# Patient Record
Sex: Female | Born: 1965 | Hispanic: No | Marital: Married | Smoking: Never smoker
Health system: Southern US, Community
[De-identification: ages and names within clinical notes are randomized; demographics above are authoritative.]

---

## 2003-06-06 ENCOUNTER — Other Ambulatory Visit: Admission: RE | Admit: 2003-06-06 | Discharge: 2003-06-06 | Payer: Self-pay | Admitting: Gynecology

## 2006-09-02 ENCOUNTER — Other Ambulatory Visit: Admission: RE | Admit: 2006-09-02 | Discharge: 2006-09-02 | Payer: Self-pay | Admitting: Gynecology

## 2006-09-18 ENCOUNTER — Encounter: Admission: RE | Admit: 2006-09-18 | Discharge: 2006-09-18 | Payer: Self-pay | Admitting: Gynecology

## 2007-09-17 ENCOUNTER — Other Ambulatory Visit: Admission: RE | Admit: 2007-09-17 | Discharge: 2007-09-17 | Payer: Self-pay | Admitting: Gynecology

## 2007-09-24 ENCOUNTER — Encounter: Admission: RE | Admit: 2007-09-24 | Discharge: 2007-09-24 | Payer: Self-pay | Admitting: Gynecology

## 2008-09-27 ENCOUNTER — Encounter: Admission: RE | Admit: 2008-09-27 | Discharge: 2008-09-27 | Payer: Self-pay | Admitting: Gynecology

## 2009-04-13 ENCOUNTER — Emergency Department (HOSPITAL_BASED_OUTPATIENT_CLINIC_OR_DEPARTMENT_OTHER): Admission: EM | Admit: 2009-04-13 | Discharge: 2009-04-13 | Payer: Self-pay | Admitting: Emergency Medicine

## 2009-04-13 ENCOUNTER — Ambulatory Visit: Payer: Self-pay | Admitting: Interventional Radiology

## 2009-05-12 ENCOUNTER — Encounter: Admission: RE | Admit: 2009-05-12 | Discharge: 2009-05-17 | Payer: Self-pay | Admitting: Orthopedic Surgery

## 2009-09-29 ENCOUNTER — Encounter: Admission: RE | Admit: 2009-09-29 | Discharge: 2009-09-29 | Payer: Self-pay | Admitting: Gynecology

## 2009-10-09 ENCOUNTER — Encounter: Admission: RE | Admit: 2009-10-09 | Discharge: 2009-10-09 | Payer: Self-pay | Admitting: Gynecology

## 2010-10-04 ENCOUNTER — Encounter
Admission: RE | Admit: 2010-10-04 | Discharge: 2010-10-04 | Payer: Self-pay | Source: Home / Self Care | Attending: Gynecology | Admitting: Gynecology

## 2010-11-04 ENCOUNTER — Encounter: Payer: Self-pay | Admitting: Gynecology

## 2011-09-19 ENCOUNTER — Other Ambulatory Visit: Payer: Self-pay | Admitting: Gynecology

## 2011-09-19 DIAGNOSIS — Z1231 Encounter for screening mammogram for malignant neoplasm of breast: Secondary | ICD-10-CM

## 2011-10-21 ENCOUNTER — Ambulatory Visit
Admission: RE | Admit: 2011-10-21 | Discharge: 2011-10-21 | Disposition: A | Discharge status: Home / Self Care | Payer: 59 | Source: Ambulatory Visit | Attending: Gynecology | Admitting: Gynecology

## 2011-10-21 DIAGNOSIS — Z1231 Encounter for screening mammogram for malignant neoplasm of breast: Secondary | ICD-10-CM

## 2012-09-30 ENCOUNTER — Other Ambulatory Visit: Payer: Self-pay | Admitting: Gynecology

## 2012-09-30 DIAGNOSIS — Z1231 Encounter for screening mammogram for malignant neoplasm of breast: Secondary | ICD-10-CM

## 2012-11-03 ENCOUNTER — Ambulatory Visit
Admission: RE | Admit: 2012-11-03 | Discharge: 2012-11-03 | Disposition: A | Discharge status: Home / Self Care | Payer: 59 | Source: Ambulatory Visit | Attending: Gynecology | Admitting: Gynecology

## 2012-11-03 DIAGNOSIS — Z1231 Encounter for screening mammogram for malignant neoplasm of breast: Secondary | ICD-10-CM

## 2013-11-08 ENCOUNTER — Other Ambulatory Visit: Payer: Self-pay

## 2013-11-08 DIAGNOSIS — Z1231 Encounter for screening mammogram for malignant neoplasm of breast: Secondary | ICD-10-CM

## 2013-11-18 ENCOUNTER — Ambulatory Visit
Admission: RE | Admit: 2013-11-18 | Discharge: 2013-11-18 | Disposition: A | Discharge status: Home / Self Care | Payer: 59 | Source: Ambulatory Visit

## 2013-11-18 DIAGNOSIS — Z1231 Encounter for screening mammogram for malignant neoplasm of breast: Secondary | ICD-10-CM

## 2014-11-08 ENCOUNTER — Other Ambulatory Visit: Payer: Self-pay

## 2014-11-08 DIAGNOSIS — Z1231 Encounter for screening mammogram for malignant neoplasm of breast: Secondary | ICD-10-CM

## 2014-11-21 ENCOUNTER — Ambulatory Visit
Admission: RE | Admit: 2014-11-21 | Discharge: 2014-11-21 | Disposition: A | Discharge status: Home / Self Care | Payer: 59 | Source: Ambulatory Visit

## 2014-11-21 DIAGNOSIS — Z1231 Encounter for screening mammogram for malignant neoplasm of breast: Secondary | ICD-10-CM

## 2015-10-31 ENCOUNTER — Other Ambulatory Visit: Payer: Self-pay

## 2015-10-31 DIAGNOSIS — Z1231 Encounter for screening mammogram for malignant neoplasm of breast: Secondary | ICD-10-CM

## 2015-11-27 ENCOUNTER — Ambulatory Visit
Admission: RE | Admit: 2015-11-27 | Discharge: 2015-11-27 | Disposition: A | Discharge status: Home / Self Care | Payer: 59 | Source: Ambulatory Visit

## 2015-11-27 DIAGNOSIS — Z1231 Encounter for screening mammogram for malignant neoplasm of breast: Secondary | ICD-10-CM

## 2016-11-04 ENCOUNTER — Other Ambulatory Visit: Payer: Self-pay | Admitting: Family Medicine

## 2016-11-04 DIAGNOSIS — Z1231 Encounter for screening mammogram for malignant neoplasm of breast: Secondary | ICD-10-CM

## 2016-11-28 ENCOUNTER — Ambulatory Visit
Admission: RE | Admit: 2016-11-28 | Discharge: 2016-11-28 | Disposition: A | Discharge status: Home / Self Care | Payer: 59 | Source: Ambulatory Visit | Attending: Family Medicine | Admitting: Family Medicine

## 2016-11-28 DIAGNOSIS — Z1231 Encounter for screening mammogram for malignant neoplasm of breast: Secondary | ICD-10-CM

## 2016-12-06 ENCOUNTER — Emergency Department (HOSPITAL_BASED_OUTPATIENT_CLINIC_OR_DEPARTMENT_OTHER)
Admission: EM | Admit: 2016-12-06 | Discharge: 2016-12-06 | Disposition: A | Discharge status: Home / Self Care | Payer: 59 | Attending: Emergency Medicine | Admitting: Emergency Medicine

## 2016-12-06 ENCOUNTER — Emergency Department (HOSPITAL_BASED_OUTPATIENT_CLINIC_OR_DEPARTMENT_OTHER): Payer: 59

## 2016-12-06 ENCOUNTER — Encounter (HOSPITAL_BASED_OUTPATIENT_CLINIC_OR_DEPARTMENT_OTHER): Payer: Self-pay | Admitting: *Deleted

## 2016-12-06 DIAGNOSIS — S299XXA Unspecified injury of thorax, initial encounter: Secondary | ICD-10-CM | POA: Diagnosis present

## 2016-12-06 DIAGNOSIS — W108XXA Fall (on) (from) other stairs and steps, initial encounter: Secondary | ICD-10-CM | POA: Diagnosis not present

## 2016-12-06 DIAGNOSIS — W19XXXA Unspecified fall, initial encounter: Secondary | ICD-10-CM

## 2016-12-06 DIAGNOSIS — R0781 Pleurodynia: Secondary | ICD-10-CM | POA: Insufficient documentation

## 2016-12-06 DIAGNOSIS — M546 Pain in thoracic spine: Secondary | ICD-10-CM | POA: Diagnosis not present

## 2016-12-06 DIAGNOSIS — Y999 Unspecified external cause status: Secondary | ICD-10-CM | POA: Insufficient documentation

## 2016-12-06 DIAGNOSIS — Y939 Activity, unspecified: Secondary | ICD-10-CM | POA: Insufficient documentation

## 2016-12-06 DIAGNOSIS — Y929 Unspecified place or not applicable: Secondary | ICD-10-CM | POA: Diagnosis not present

## 2016-12-06 MED ORDER — IBUPROFEN 800 MG PO TABS
800.0000 mg | ORAL_TABLET | Freq: Once | ORAL | Status: AC
  Administered 2016-12-06: 800 mg via ORAL
  Filled 2016-12-06: qty 1

## 2016-12-06 MED ORDER — IBUPROFEN 600 MG PO TABS: 600.0000 mg | ORAL_TABLET | Freq: Four times a day (QID) | ORAL | 0 refills | Status: AC | PRN

## 2016-12-06 NOTE — ED Notes (Signed)
Pt. Does not want ice on her back/rib area.

## 2016-12-06 NOTE — ED Triage Notes (Signed)
Pt reports slip and fall down flight of stairs onto hardwood floor. Denies head injury or loc, states her pain is in the left upper back area, tender to palp.

## 2016-12-06 NOTE — ED Provider Notes (Signed)
[facility] Note   CSN: [id number] Arrival date & time: 12/06/16  1557  By signing my name below, I, Modena Jansky, attest that this documentation has been prepared under the direction and in the presence of non-physician practitioner, Mathews Robinsons, PA-C. Electronically Signed: Modena Jansky, Scribe. 12/06/2016. 5:48 PM.  History   Chief Complaint Chief Complaint  Patient presents with  . Fall   The history is provided by the patient. No language interpreter was used.   HPI Comments: Kim Phillips is a 51 y.o. female who presents to the Emergency Department complaining of a fall that occurred today. She states she slipped, fell down 4-5 steps, and landed on her back on hardwood floor. She denies any LOC or head injury. No treatment PTA. She reports associated constant moderate left upper back pain. Her pain is exacerbated by raising her LUE, deep breathing, and bending over. She denies any visual disturbance, nausea, vomiting, gait problem, or dizziness.     PCP: Shirlean Kelly, MD  History reviewed. No pertinent past medical history.  There are no active problems to display for this patient.   History reviewed. No pertinent surgical history.  OB History    No data available       Home Medications    Prior to Admission medications   Medication Sig Start Date End Date Taking? Authorizing Provider  ibuprofen (ADVIL,MOTRIN) 600 MG tablet Take 1 tablet (600 mg total) by mouth every 6 (six) hours as needed. 12/06/16   Georgiana Shore, PA-C    Family History History reviewed. No pertinent family history.  Social History Social History  Substance Use Topics  . Smoking status: Never Smoker  . Smokeless tobacco: Never Used  . Alcohol use Not on file     Allergies   Patient has no known allergies.   Review of Systems Review of Systems  Constitutional: Negative for fever.  HENT: Negative for sore throat.   Eyes: Negative for visual  disturbance.  Respiratory: Negative for shortness of breath.   Gastrointestinal: Negative for nausea and vomiting.  Musculoskeletal: Positive for back pain (Left upper). Negative for gait problem.  Skin: Negative for rash.  Neurological: Negative for dizziness and syncope.     Physical Exam Updated Vital Signs BP (!) 149/102 (BP Location: Left Arm)   Pulse 64   Temp 98.9 F (37.2 C) (Oral)   Resp 18   Ht 5\' 6"  (1.676 m)   Wt 166 lb (75.3 kg)   LMP 09/07/2011   SpO2 100%   BMI 26.79 kg/m   Physical Exam  Constitutional: She is oriented to person, place, and time. She appears well-developed and well-nourished. No distress.  Patient is afebrile, non-toxic appearing, seating comfortably in chair in no acute distress.  HENT:  Head: Normocephalic and atraumatic.  Eyes: Conjunctivae and EOM are normal. Pupils are equal, round, and reactive to light.  Neck: Normal range of motion. Neck supple.  Cardiovascular: Normal rate, regular rhythm, normal heart sounds and intact distal pulses.   Pulmonary/Chest: Effort normal. No respiratory distress. She has no wheezes. She has no rales.  Abdominal: Soft.  Musculoskeletal: Normal range of motion. She exhibits tenderness. She exhibits no edema or deformity.  TTP to the left 5th and 6th rib on th left posteriorly. Full ROM of all extremities. No midline TTP to the entire spine.   Neurological: She is alert and oriented to person, place, and time. No cranial nerve deficit or sensory deficit. She exhibits normal muscle  tone. Coordination normal.  Neurologic Exam:   - Mental status: Patient is alert and cooperative. Fluent speech and words are clear. Coherent thought processes and insight is good. Patient is oriented x 4 to person, place, time and event.   - Cranial nerves:  CN III, IV, VI: pupils equally round, reactive to light both direct and conscensual and normal accommodation. Full extra-ocular movement. CN VII : muscles of facial expression  intact. CN X :  midline uvula. XI strength of sternocleidomastoid and trapezius muscles 5/5, XII: tongue is midline when protruded.  - Motor: No involuntary movements. Muscle tone and bulk normal throughout. Muscle strength is 5/5 in bilateral shoulder abduction, elbow flexion and extension, thumb opposition, grip, hip extension, flexion, leg flexion and extension, ankle dorsiflexion and plantar flexion.   - Sensory: Proprioception, light tough sensation intact in all extremities.   - Cerebellar: rapid alternating movements and point to point movement intact in upper and lower extremities. Normal stance and gait   Skin: Skin is warm and dry. She is not diaphoretic. There is erythema. No pallor.  Slight erythema over upper back  Psychiatric: She has a normal mood and affect.  Nursing note and vitals reviewed.    ED Treatments / Results  DIAGNOSTIC STUDIES: Oxygen Saturation is 100% on RA, normal by my interpretation.    COORDINATION OF CARE: 5:52 PM- Pt advised of plan for treatment and pt agrees.  Labs (all labs ordered are listed, but only abnormal results are displayed) Labs Reviewed - No data to display  EKG  EKG Interpretation None       Radiology Dg Ribs Unilateral W/chest Left  Result Date: 12/06/2016 CLINICAL DATA:  Status post fall down flight of stairs, with left upper back pain. Initial encounter. EXAM: LEFT RIBS AND CHEST - 3+ VIEW COMPARISON:  None. FINDINGS: No displaced rib fractures are seen. The lungs are well-aerated and clear. There is no evidence of focal opacification, pleural effusion or pneumothorax. The cardiomediastinal silhouette is within normal limits. No acute osseous abnormalities are seen. IMPRESSION: No acute cardiopulmonary process seen. No displaced rib fractures identified. Electronically Signed   By: Roanna Raider M.D.   On: 12/06/2016 18:21    Procedures Procedures (including critical care time)  Medications Ordered in ED Medications    ibuprofen (ADVIL,MOTRIN) tablet 800 mg (800 mg Oral Given 12/06/16 1819)     Initial Impression / Assessment and Plan / ED Course  I have reviewed the triage vital signs and the nursing notes.  Pertinent labs & imaging results that were available during my care of the patient were reviewed by me and considered in my medical decision making (see chart for details).     Patient presents status post slip and fall. No loss of consciousness or hit to the head. She has pain to the left upper back. Exam is reassuring no focal neural deficit normal neuro exam. Onset clear and equal bilaterally.  Patient X-Ray negative for obvious fracture or dislocation. Pain managed in ED. Pt advised to follow up with PCP if symptoms persist for possibility of missed fracture diagnosis. Conservative therapy recommended and discussed. Patient will be dc home & is agreeable with above plan.   Discussed strict return precautions. Patient was advised to return to the emergency department if experiencing any new or worsening symptoms. Patient clearly understood instructions and agreed with discharge plan.    Final Clinical Impressions(s) / ED Diagnoses   Final diagnoses:  Fall, initial encounter    New Prescriptions  Discharge Medication List as of 12/06/2016  7:34 PM    START taking these medications   Details  ibuprofen (ADVIL,MOTRIN) 600 MG tablet Take 1 tablet (600 mg total) by mouth every 6 (six) hours as needed., Starting Fri 12/06/2016, Print       I personally performed the services described in this documentation, which was scribed in my presence. The recorded information has been reviewed and is accurate.     Georgiana ShoreJessica B Renie Stelmach, PA-C 12/06/16 2108    Arby BarretteMarcy Pfeiffer, MD 12/18/16 [phone]

## 2017-11-18 ENCOUNTER — Other Ambulatory Visit: Payer: Self-pay | Admitting: Family Medicine

## 2017-11-18 DIAGNOSIS — Z1231 Encounter for screening mammogram for malignant neoplasm of breast: Secondary | ICD-10-CM

## 2017-12-08 ENCOUNTER — Ambulatory Visit: Payer: 59

## 2018-02-18 ENCOUNTER — Ambulatory Visit
Admission: RE | Admit: 2018-02-18 | Discharge: 2018-02-18 | Disposition: A | Discharge status: Home / Self Care | Payer: 59 | Source: Ambulatory Visit | Attending: Family Medicine | Admitting: Family Medicine

## 2018-02-18 DIAGNOSIS — Z1231 Encounter for screening mammogram for malignant neoplasm of breast: Secondary | ICD-10-CM

## 2019-06-16 ENCOUNTER — Other Ambulatory Visit: Payer: Self-pay | Admitting: Family Medicine

## 2019-06-16 DIAGNOSIS — M8589 Other specified disorders of bone density and structure, multiple sites: Secondary | ICD-10-CM

## 2019-11-12 ENCOUNTER — Ambulatory Visit: Payer: 59 | Attending: Internal Medicine

## 2019-11-12 DIAGNOSIS — Z20822 Contact with and (suspected) exposure to covid-19: Secondary | ICD-10-CM

## 2019-11-13 LAB — NOVEL CORONAVIRUS, NAA: SARS-CoV-2, NAA: NOT DETECTED

## 2019-11-15 ENCOUNTER — Ambulatory Visit: Payer: 59

## 2019-11-24 IMAGING — MG DIGITAL SCREENING BILATERAL MAMMOGRAM WITH CAD
4 series · 4 of 4 positions shown · non-contrast
Comparison: Previous exam(s).

CLINICAL DATA: Screening.

EXAM:
DIGITAL SCREENING BILATERAL MAMMOGRAM WITH CAD

[L CC]
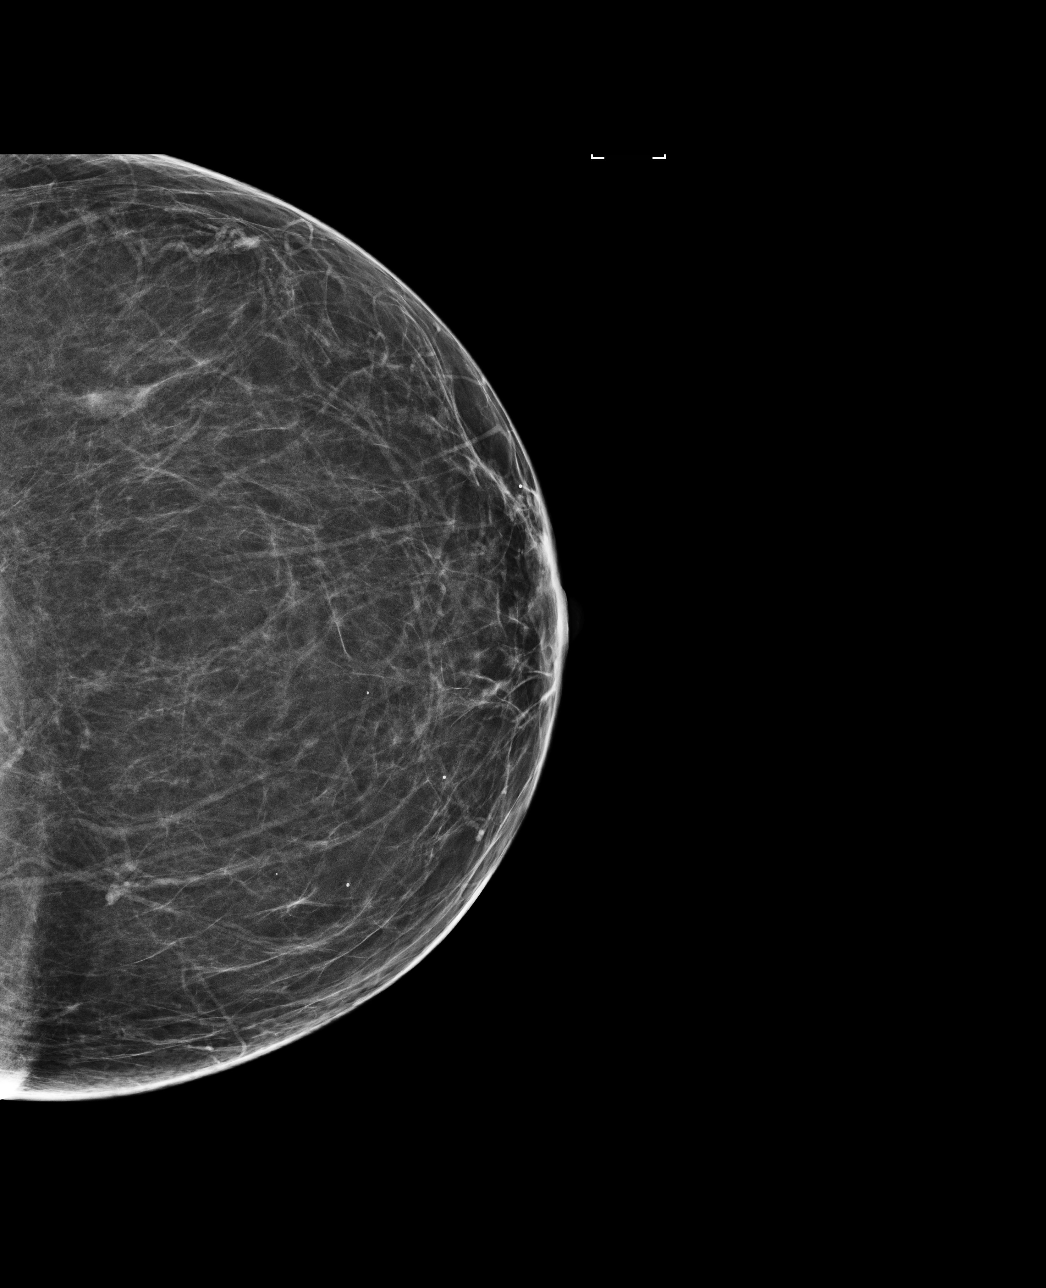

[R CC]
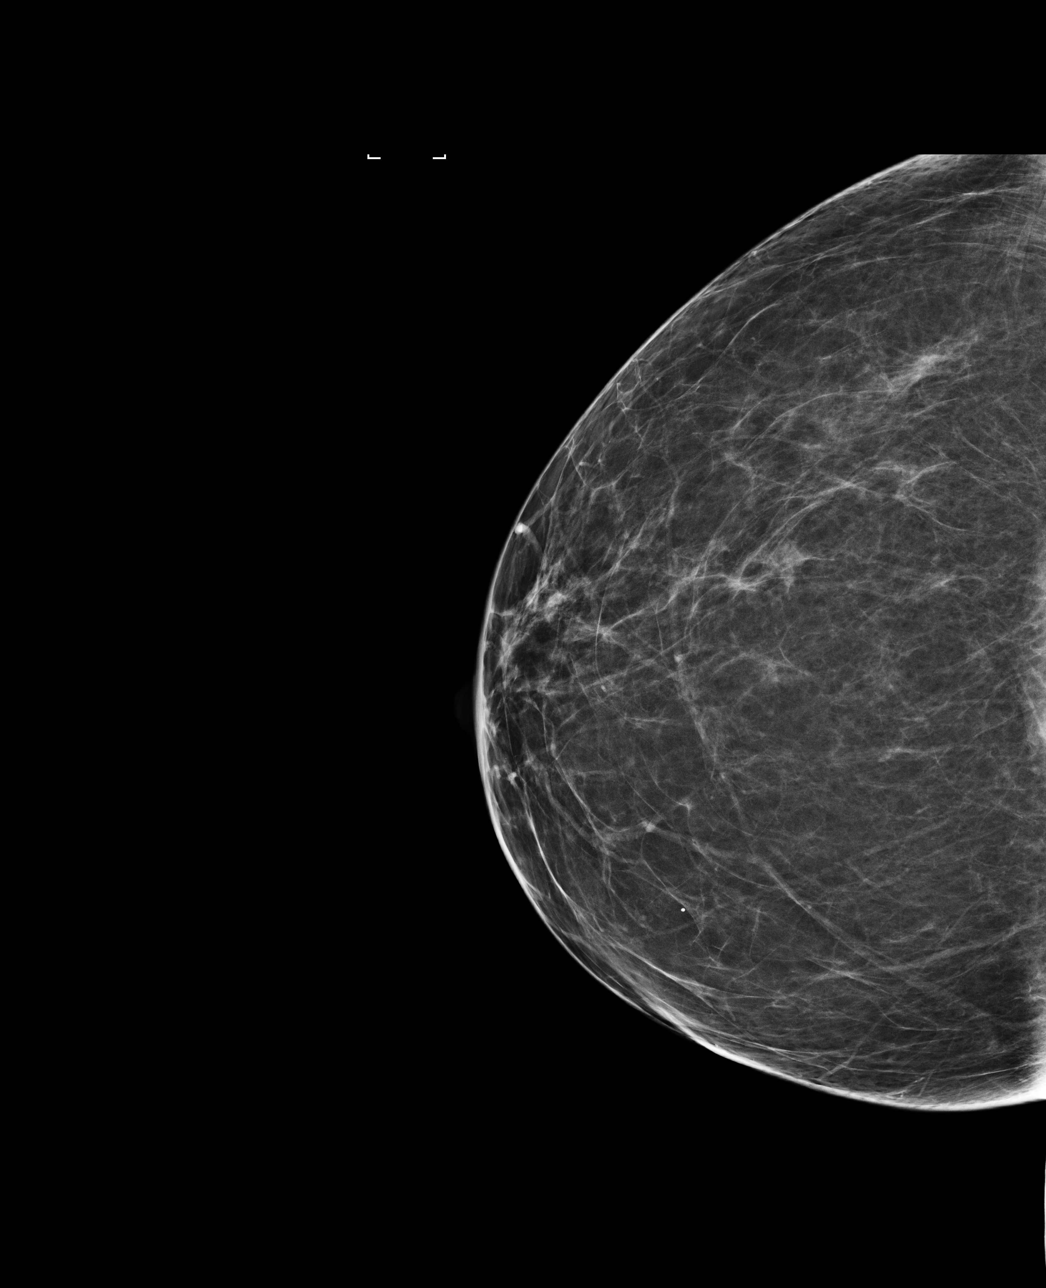

[R MLO]
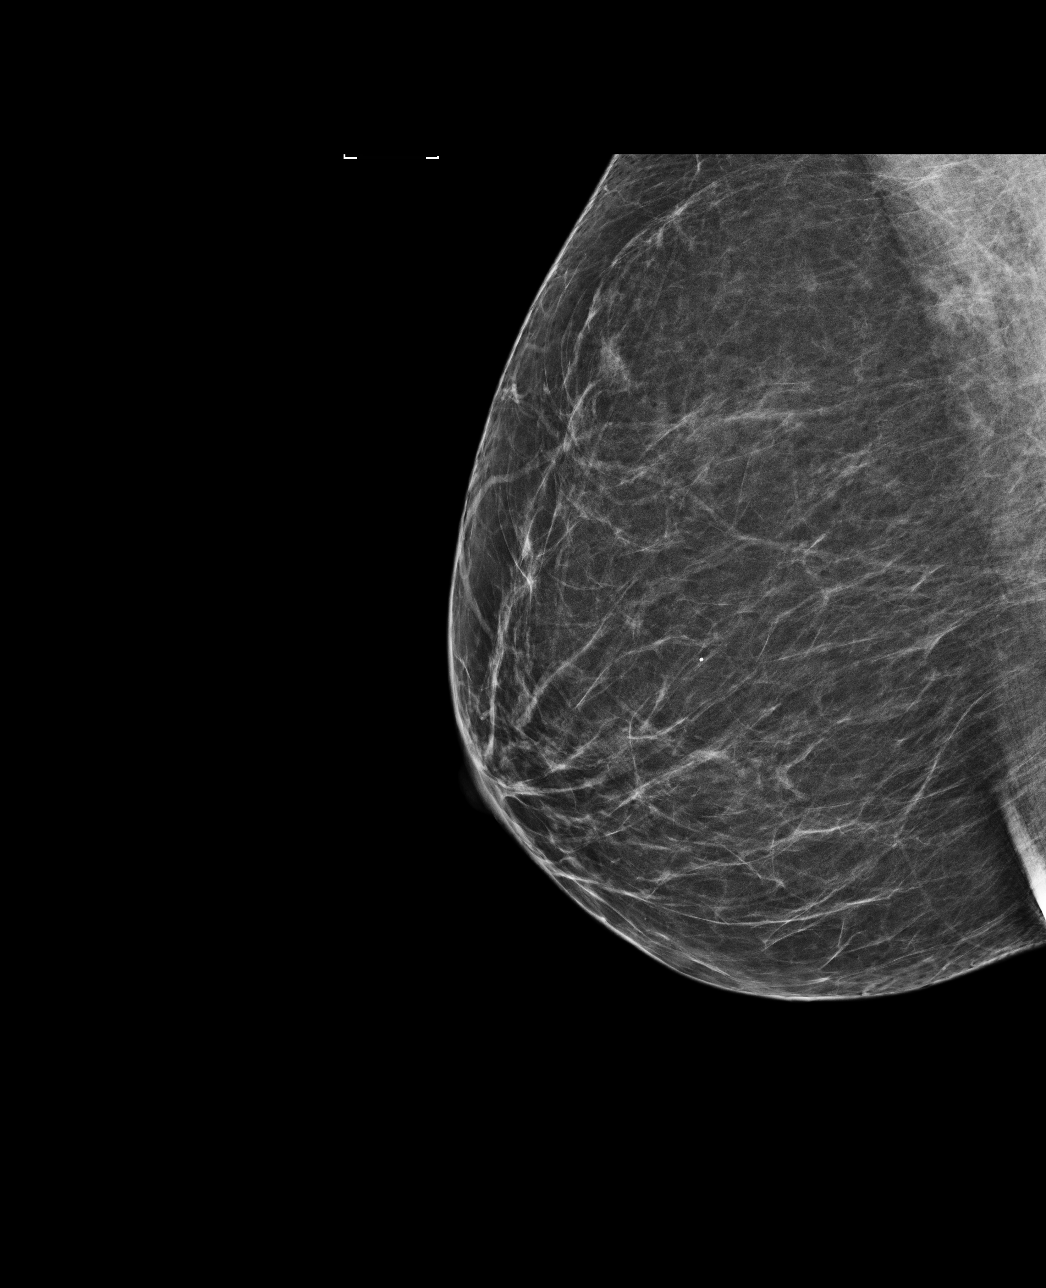

[L MLO]
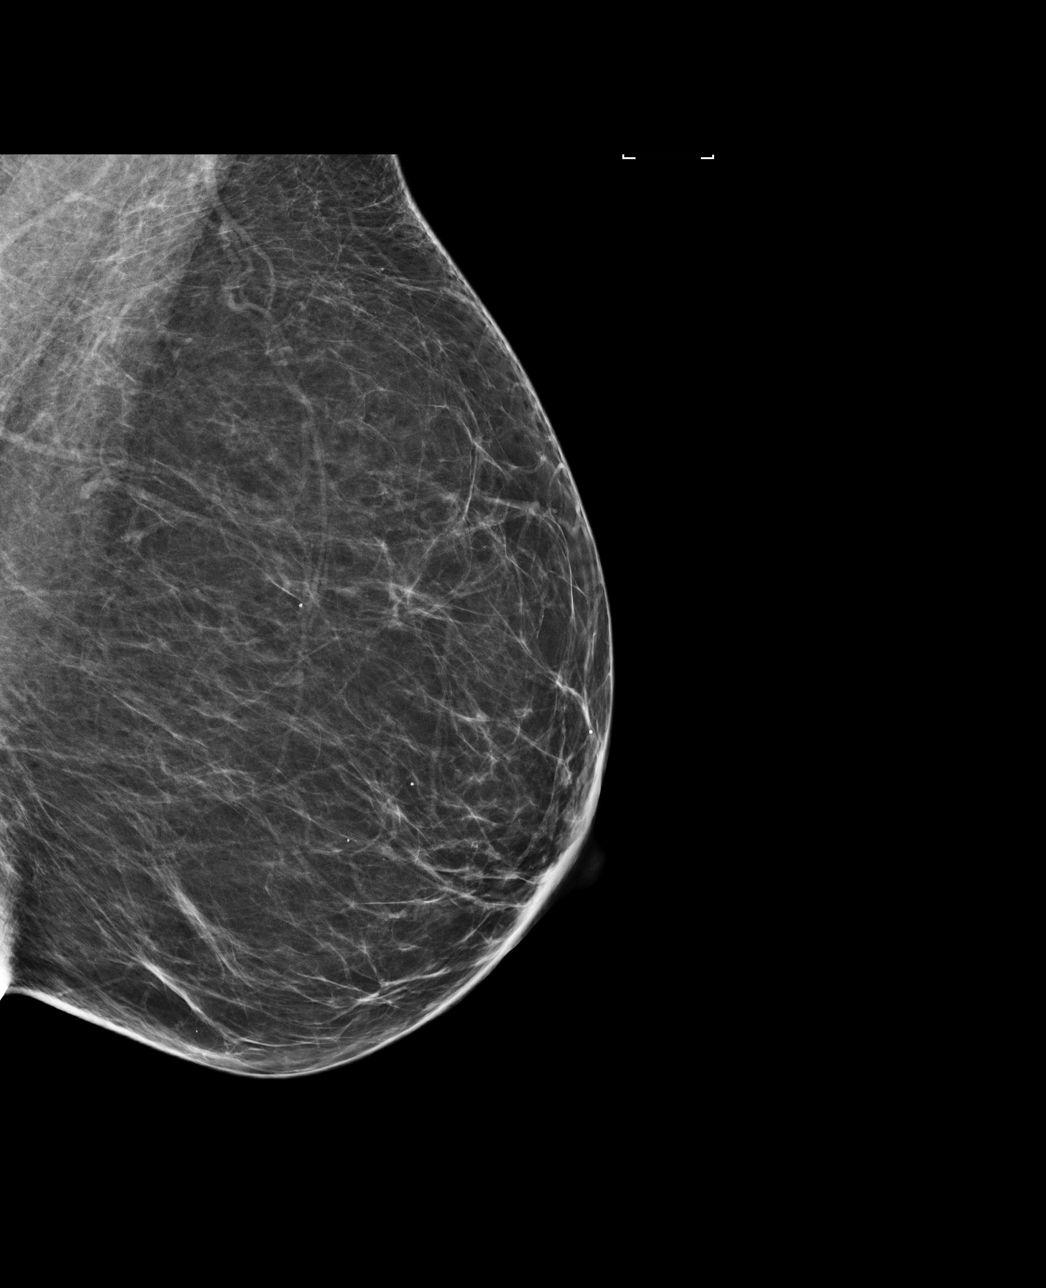

[4 of 4 positions shown; findings below may reference images not displayed]

ACR Breast Density Category b: There are scattered areas of
fibroglandular density.
FINDINGS: There are no findings suspicious for malignancy. Images were
processed with CAD.
IMPRESSION: No mammographic evidence of malignancy. A result letter of this
screening mammogram will be mailed directly to the patient.

RECOMMENDATION:
Screening mammogram in one year. (Code:AS-G-LCT)

BI-RADS CATEGORY  1: Negative.
